# Patient Record
Sex: Male | Born: 1985 | Hispanic: No | Marital: Married | Smoking: Current some day smoker
Health system: Southern US, Community
[De-identification: ages and names within clinical notes are randomized; demographics above are authoritative.]

---

## 2017-06-10 ENCOUNTER — Ambulatory Visit: Payer: Self-pay | Admitting: Physician Assistant

## 2017-07-26 ENCOUNTER — Ambulatory Visit: Payer: Self-pay | Admitting: Emergency Medicine

## 2017-07-27 ENCOUNTER — Other Ambulatory Visit: Payer: Self-pay

## 2017-07-27 ENCOUNTER — Ambulatory Visit (INDEPENDENT_AMBULATORY_CARE_PROVIDER_SITE_OTHER): Payer: Commercial Managed Care - PPO

## 2017-07-27 ENCOUNTER — Encounter: Payer: Self-pay | Admitting: Family Medicine

## 2017-07-27 ENCOUNTER — Ambulatory Visit: Payer: Commercial Managed Care - PPO | Admitting: Family Medicine

## 2017-07-27 VITALS — BP 112/78 | HR 101 | Temp 98.2°F | Resp 18 | Ht 72.84 in | Wt 225.8 lb

## 2017-07-27 DIAGNOSIS — R05 Cough: Secondary | ICD-10-CM

## 2017-07-27 DIAGNOSIS — R059 Cough, unspecified: Secondary | ICD-10-CM

## 2017-07-27 DIAGNOSIS — R509 Fever, unspecified: Secondary | ICD-10-CM | POA: Diagnosis not present

## 2017-07-27 DIAGNOSIS — J189 Pneumonia, unspecified organism: Secondary | ICD-10-CM

## 2017-07-27 MED ORDER — HYDROCODONE-HOMATROPINE 5-1.5 MG/5ML PO SYRP: ORAL_SOLUTION | ORAL | 0 refills | Status: DC

## 2017-07-27 MED ORDER — AZITHROMYCIN 250 MG PO TABS
ORAL_TABLET | ORAL | 0 refills | Status: DC
Start: 2017-07-27 — End: 2020-02-26

## 2017-07-27 NOTE — Patient Instructions (Addendum)
I am suspicious of a pneumonia after a possible initial virus.  Start antibiotic, 2 pills tonight, then 1 pill/day for the next 4 days.  Mucinex during the day as needed for cough, Tylenol or Advil over-the-counter for fever and body aches.  Hydrocodone cough syrup if needed at bedtime.  Recheck in the next 2 to 3 days if not significantly improved, sooner or to the emergency room if worse.  Thank you for coming in today.   Community-Acquired Pneumonia, Adult Pneumonia is an infection of the lungs. One type of pneumonia can happen while a person is in a hospital. A different type can happen when a person is not in a hospital (community-acquired pneumonia). It is easy for this kind to spread from person to person. It can spread to you if you breathe near an infected person who coughs or sneezes. Some symptoms include:  A dry cough.  A wet (productive) cough.  Fever.  Sweating.  Chest pain.  Follow these instructions at home:  Take over-the-counter and prescription medicines only as told by your doctor. ? Only take cough medicine if you are losing sleep. ? If you were prescribed an antibiotic medicine, take it as told by your doctor. Do not stop taking the antibiotic even if you start to feel better.  Sleep with your head and neck raised (elevated). You can do this by putting a few pillows under your head, or you can sleep in a recliner.  Do not use tobacco products. These include cigarettes, chewing tobacco, and e-cigarettes. If you need help quitting, ask your doctor.  Drink enough water to keep your pee (urine) clear or pale yellow. A shot (vaccine) can help prevent pneumonia. Shots are often suggested for:  People older than 32 years of age.  People older than 32 years of age: ? Who are having cancer treatment. ? Who have long-term (chronic) lung disease. ? Who have problems with their body's defense system (immune system).  You may also prevent pneumonia if you take these  actions:  Get the flu (influenza) shot every year.  Go to the dentist as often as told.  Wash your hands often. If soap and water are not available, use hand sanitizer.  Contact a doctor if:  You have a fever.  You lose sleep because your cough medicine does not help. Get help right away if:  You are short of breath and it gets worse.  You have more chest pain.  Your sickness gets worse. This is very serious if: ? You are an older adult. ? Your body's defense system is weak.  You cough up blood. This information is not intended to replace advice given to you by your health care provider. Make sure you discuss any questions you have with your health care provider. Document Released: 07/27/2007 Document Revised: 07/16/2015 Document Reviewed: 06/04/2014 Elsevier Interactive Patient Education  2018 Elsevier Inc.  Cough, Adult Coughing is a reflex that clears your throat and your airways. Coughing helps to heal and protect your lungs. It is normal to cough occasionally, but a cough that happens with other symptoms or lasts a long time may be a sign of a condition that needs treatment. A cough may last only 2-3 weeks (acute), or it may last longer than 8 weeks (chronic). What are the causes? Coughing is commonly caused by:  Breathing in substances that irritate your lungs.  A viral or bacterial respiratory infection.  Allergies.  Asthma.  Postnasal drip.  Smoking.  Acid backing  up from the stomach into the esophagus (gastroesophageal reflux).  Certain medicines.  Chronic lung problems, including COPD (or rarely, lung cancer).  Other medical conditions such as heart failure.  Follow these instructions at home: Pay attention to any changes in your symptoms. Take these actions to help with your discomfort:  Take medicines only as told by your health care provider. ? If you were prescribed an antibiotic medicine, take it as told by your health care provider. Do not  stop taking the antibiotic even if you start to feel better. ? Talk with your health care provider before you take a cough suppressant medicine.  Drink enough fluid to keep your urine clear or pale yellow.  If the air is dry, use a cold steam vaporizer or humidifier in your bedroom or your home to help loosen secretions.  Avoid anything that causes you to cough at work or at home.  If your cough is worse at night, try sleeping in a semi-upright position.  Avoid cigarette smoke. If you smoke, quit smoking. If you need help quitting, ask your health care provider.  Avoid caffeine.  Avoid alcohol.  Rest as needed.  Contact a health care provider if:  You have new symptoms.  You cough up pus.  Your cough does not get better after 2-3 weeks, or your cough gets worse.  You cannot control your cough with suppressant medicines and you are losing sleep.  You develop pain that is getting worse or pain that is not controlled with pain medicines.  You have a fever.  You have unexplained weight loss.  You have night sweats. Get help right away if:  You cough up blood.  You have difficulty breathing.  Your heartbeat is very fast. This information is not intended to replace advice given to you by your health care provider. Make sure you discuss any questions you have with your health care provider. Document Released: 08/06/2010 Document Revised: 07/16/2015 Document Reviewed: 04/16/2014 Elsevier Interactive Patient Education  2018 [employer].    IF you received an x-ray today, you will receive an invoice from [facility]. Please contact [facility] at [phone] with questions or concerns regarding your invoice.   IF you received labwork today, you will receive an invoice from [facility]. Please contact [facility] at [phone] with questions or concerns regarding your invoice.   Our billing staff will not be able to assist you with questions regarding bills  from these companies.  You will be contacted with the lab results as soon as they are available. The fastest way to get your results is to activate your My Chart account. Instructions are located on the last page of this paperwork. If you have not heard from [place] regarding the results in 2 weeks, please contact this office.

## 2017-07-27 NOTE — Progress Notes (Signed)
Subjective:    Patient ID: Raymond Yates, male    DOB: [birth year], 32 y.o.   MRN: [id number]  HPI Raymond Yates is a 32 y.o. male Presents today for: Chief Complaint  Patient presents with  . Cough    hurts when he coughs feels like a burning in chest green mucus   . Anorexia  . Fatigue   Flew back from [place] last week, few days later - sneeze, cough, headache, decreased appetite, fatigue.  worse cough past few days. burning with cough.  Green phlegm past few days. Fever few days ago, treated with advil. drinking fluids ok, but decreased appetite.  No known chronic medical conditions. Tx: tylenol severe cold. Advil.  Minimal improvement. T102.7 this am. Improved with advil. Last dose 1pm.   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    There are no active problems to display for this patient.  History reviewed. No pertinent past medical history. History reviewed. No pertinent surgical history. No Known Allergies Prior to Admission medications   Not on File   Social History   Socioeconomic History  . Marital status: Married    Spouse name: Not on file  . Number of children: Not on file  . Years of education: Not on file  . Highest education level: Not on file  Occupational History  . Not on file  Social Needs  . Financial resource strain: Not on file  . Food insecurity:    Worry: Not on file    Inability: Not on file  . Transportation needs:    Medical: Not on file    Non-medical: Not on file  Tobacco Use  . Smoking status: Former Games developer  . Smokeless tobacco: Former Engineer, water and Sexual Activity  . Alcohol use: Not on file  . Drug use: Not on file  . Sexual activity: Not on file  Lifestyle  . Physical activity:    Days per week: Not on file    Minutes per session: Not on file  . Stress: Not on file  Relationships  . Social connections:    Talks on phone: Not on file    Gets together: Not on file    Attends religious service: Not on file    Active member of club or organization: Not on file    Attends meetings of clubs or organizations: Not on file    Relationship status: Not on file  . Intimate partner violence:    Fear of current or ex partner: Not on file    Emotionally abused: Not on file    Physically abused: Not on file    Forced sexual activity: Not on file  Other Topics Concern  . Not on file  Social History Narrative  . Not on file    Review of Systems  Constitutional: Positive for chills, fatigue and fever.  Respiratory: Positive for cough. Negative for choking, shortness of breath and wheezing.   Cardiovascular: Positive for chest pain (burning with cough. ).  Neurological: Positive for headaches.      Objective:   Physical Exam  Constitutional: He is oriented to person, place, and time. He appears well-developed and well-nourished.  HENT:  Head: Normocephalic and atraumatic.  Right Ear: Tympanic membrane, external ear and ear canal normal.  Left Ear: Tympanic membrane, external ear and ear canal normal.  Nose: No rhinorrhea.  Mouth/Throat: Oropharynx is clear and moist and mucous membranes are normal. No oropharyngeal exudate or posterior oropharyngeal erythema.  Eyes: Pupils are equal,  round, and reactive to light. Conjunctivae are normal.  Neck: Neck supple.  Cardiovascular: Normal rate, regular rhythm, normal heart sounds and intact distal pulses.  No murmur heard. Pulmonary/Chest: Effort normal. He has decreased breath sounds (distant, but normal effort, no distress. ). He has no wheezes. He has no rhonchi. He has no rales.  Abdominal: Soft. There is no tenderness.  Lymphadenopathy:    He has no cervical adenopathy.  Neurological: He is alert and oriented to person, place, and time.  Skin: Skin is warm and dry. No rash noted. He is not diaphoretic.  Psychiatric: He has a normal mood and affect. His behavior is normal.  Vitals reviewed.  Vitals:   07/27/17 1805  BP: 112/78  Pulse: (!) 101    Resp: 18  Temp: 98.2 F (36.8 C)  TempSrc: Oral  SpO2: 97%  Weight: 225 lb 12.8 oz (102.4 kg)  Height: 6' 0.84" (1.85 m)    Dg Chest 2 View  Result Date: 07/27/2017 CLINICAL DATA:  Cough x1 week, fever EXAM: CHEST - 2 VIEW COMPARISON:  None. FINDINGS: Lungs are clear.  No pleural effusion or pneumothorax. The heart is normal in size. Visualized osseous structures are within normal limits. IMPRESSION: Normal chest radiographs. Electronically Signed   By: Charline Bills M.D.   On: 07/27/2017 18:55        Assessment & Plan:    Raymond Yates is a 32 y.o. male Cough - Plan: DG Chest 2 View, azithromycin (ZITHROMAX) 250 MG tablet, HYDROcodone-homatropine (HYCODAN) 5-1.5 MG/5ML syrup  Fever, unspecified - Plan: DG Chest 2 View, azithromycin (ZITHROMAX) 250 MG tablet, HYDROcodone-homatropine (HYCODAN) 5-1.5 MG/5ML syrup  Community acquired pneumonia of right lung, unspecified part of lung - Plan: azithromycin (ZITHROMAX) 250 MG tablet  Possible initial viral illness with secondary sickening, fevers past few days, suspicious for early pneumonia.  On initial evaluation of his x-ray, suspicious for right lower lobe early infiltrate.   - X-ray report was negative, but will treat for possible early community-acquired pneumonia with azithromycin as above, hydrocodone cough syrup at night if needed, Mucinex during the day, and RTC precautions if not improving in the next few days.  ER precautions given if worsening.  Meds ordered this encounter  Medications  . azithromycin (ZITHROMAX) 250 MG tablet    Sig: Take 2 pills by mouth on day 1, then 1 pill by mouth per day on days 2 through 5.    Dispense:  6 tablet    Refill:  0  . HYDROcodone-homatropine (HYCODAN) 5-1.5 MG/5ML syrup    Sig: 72m by mouth a bedtime as needed for cough.    Dispense:  120 mL    Refill:  0   Patient Instructions    I am suspicious of a pneumonia after a possible initial virus.  Start antibiotic, 2 pills  tonight, then 1 pill/day for the next 4 days.  Mucinex during the day as needed for cough, Tylenol or Advil over-the-counter for fever and body aches.  Hydrocodone cough syrup if needed at bedtime.  Recheck in the next 2 to 3 days if not significantly improved, sooner or to the emergency room if worse.  Thank you for coming in today.   Community-Acquired Pneumonia, Adult Pneumonia is an infection of the lungs. One type of pneumonia can happen while a person is in a hospital. A different type can happen when a person is not in a hospital (community-acquired pneumonia). It is easy for this kind to spread from person to person.  It can spread to you if you breathe near an infected person who coughs or sneezes. Some symptoms include:  A dry cough.  A wet (productive) cough.  Fever.  Sweating.  Chest pain.  Follow these instructions at home:  Take over-the-counter and prescription medicines only as told by your doctor. ? Only take cough medicine if you are losing sleep. ? If you were prescribed an antibiotic medicine, take it as told by your doctor. Do not stop taking the antibiotic even if you start to feel better.  Sleep with your head and neck raised (elevated). You can do this by putting a few pillows under your head, or you can sleep in a recliner.  Do not use tobacco products. These include cigarettes, chewing tobacco, and e-cigarettes. If you need help quitting, ask your doctor.  Drink enough water to keep your pee (urine) clear or pale yellow. A shot (vaccine) can help prevent pneumonia. Shots are often suggested for:  People older than 32 years of age.  People older than 32 years of age: ? Who are having cancer treatment. ? Who have long-term (chronic) lung disease. ? Who have problems with their body's defense system (immune system).  You may also prevent pneumonia if you take these actions:  Get the flu (influenza) shot every year.  Go to the dentist as often as  told.  Wash your hands often. If soap and water are not available, use hand sanitizer.  Contact a doctor if:  You have a fever.  You lose sleep because your cough medicine does not help. Get help right away if:  You are short of breath and it gets worse.  You have more chest pain.  Your sickness gets worse. This is very serious if: ? You are an older adult. ? Your body's defense system is weak.  You cough up blood. This information is not intended to replace advice given to you by your health care provider. Make sure you discuss any questions you have with your health care provider. Document Released: 07/27/2007 Document Revised: 07/16/2015 Document Reviewed: 06/04/2014 Elsevier Interactive Patient Education  2018 Elsevier Inc.  Cough, Adult Coughing is a reflex that clears your throat and your airways. Coughing helps to heal and protect your lungs. It is normal to cough occasionally, but a cough that happens with other symptoms or lasts a long time may be a sign of a condition that needs treatment. A cough may last only 2-3 weeks (acute), or it may last longer than 8 weeks (chronic). What are the causes? Coughing is commonly caused by:  Breathing in substances that irritate your lungs.  A viral or bacterial respiratory infection.  Allergies.  Asthma.  Postnasal drip.  Smoking.  Acid backing up from the stomach into the esophagus (gastroesophageal reflux).  Certain medicines.  Chronic lung problems, including COPD (or rarely, lung cancer).  Other medical conditions such as heart failure.  Follow these instructions at home: Pay attention to any changes in your symptoms. Take these actions to help with your discomfort:  Take medicines only as told by your health care provider. ? If you were prescribed an antibiotic medicine, take it as told by your health care provider. Do not stop taking the antibiotic even if you start to feel better. ? Talk with your health  care provider before you take a cough suppressant medicine.  Drink enough fluid to keep your urine clear or pale yellow.  If the air is dry, use a cold steam vaporizer  or humidifier in your bedroom or your home to help loosen secretions.  Avoid anything that causes you to cough at work or at home.  If your cough is worse at night, try sleeping in a semi-upright position.  Avoid cigarette smoke. If you smoke, quit smoking. If you need help quitting, ask your health care provider.  Avoid caffeine.  Avoid alcohol.  Rest as needed.  Contact a health care provider if:  You have new symptoms.  You cough up pus.  Your cough does not get better after 2-3 weeks, or your cough gets worse.  You cannot control your cough with suppressant medicines and you are losing sleep.  You develop pain that is getting worse or pain that is not controlled with pain medicines.  You have a fever.  You have unexplained weight loss.  You have night sweats. Get help right away if:  You cough up blood.  You have difficulty breathing.  Your heartbeat is very fast. This information is not intended to replace advice given to you by your health care provider. Make sure you discuss any questions you have with your health care provider. Document Released: 08/06/2010 Document Revised: 07/16/2015 Document Reviewed: 04/16/2014 Elsevier Interactive Patient Education  2018 [employer].    IF you received an x-ray today, you will receive an invoice from [facility]. Please contact [facility] at [phone] with questions or concerns regarding your invoice.   IF you received labwork today, you will receive an invoice from [facility]. Please contact [facility] at [phone] with questions or concerns regarding your invoice.   Our billing staff will not be able to assist you with questions regarding bills from these companies.  You will be contacted with the lab results as soon as they  are available. The fastest way to get your results is to activate your My Chart account. Instructions are located on the last page of this paperwork. If you have not heard from [place] regarding the results in 2 weeks, please contact this office.     Signed,   Meredith Staggers, MD Primary Care at [facility].  07/27/17 7:01 PM

## 2018-02-21 DIAGNOSIS — E109 Type 1 diabetes mellitus without complications: Secondary | ICD-10-CM | POA: Diagnosis not present

## 2018-03-09 DIAGNOSIS — E8881 Metabolic syndrome: Secondary | ICD-10-CM | POA: Diagnosis not present

## 2018-03-24 DIAGNOSIS — E109 Type 1 diabetes mellitus without complications: Secondary | ICD-10-CM | POA: Diagnosis not present

## 2018-04-22 DIAGNOSIS — E109 Type 1 diabetes mellitus without complications: Secondary | ICD-10-CM | POA: Diagnosis not present

## 2018-05-23 DIAGNOSIS — E109 Type 1 diabetes mellitus without complications: Secondary | ICD-10-CM | POA: Diagnosis not present

## 2019-05-14 IMAGING — DX DG CHEST 2V
2 series · 2 of 2 positions shown · non-contrast
Comparison: None.

CLINICAL DATA: Cough x1 week, fever

EXAM:
CHEST - 2 VIEW

[chest pa]
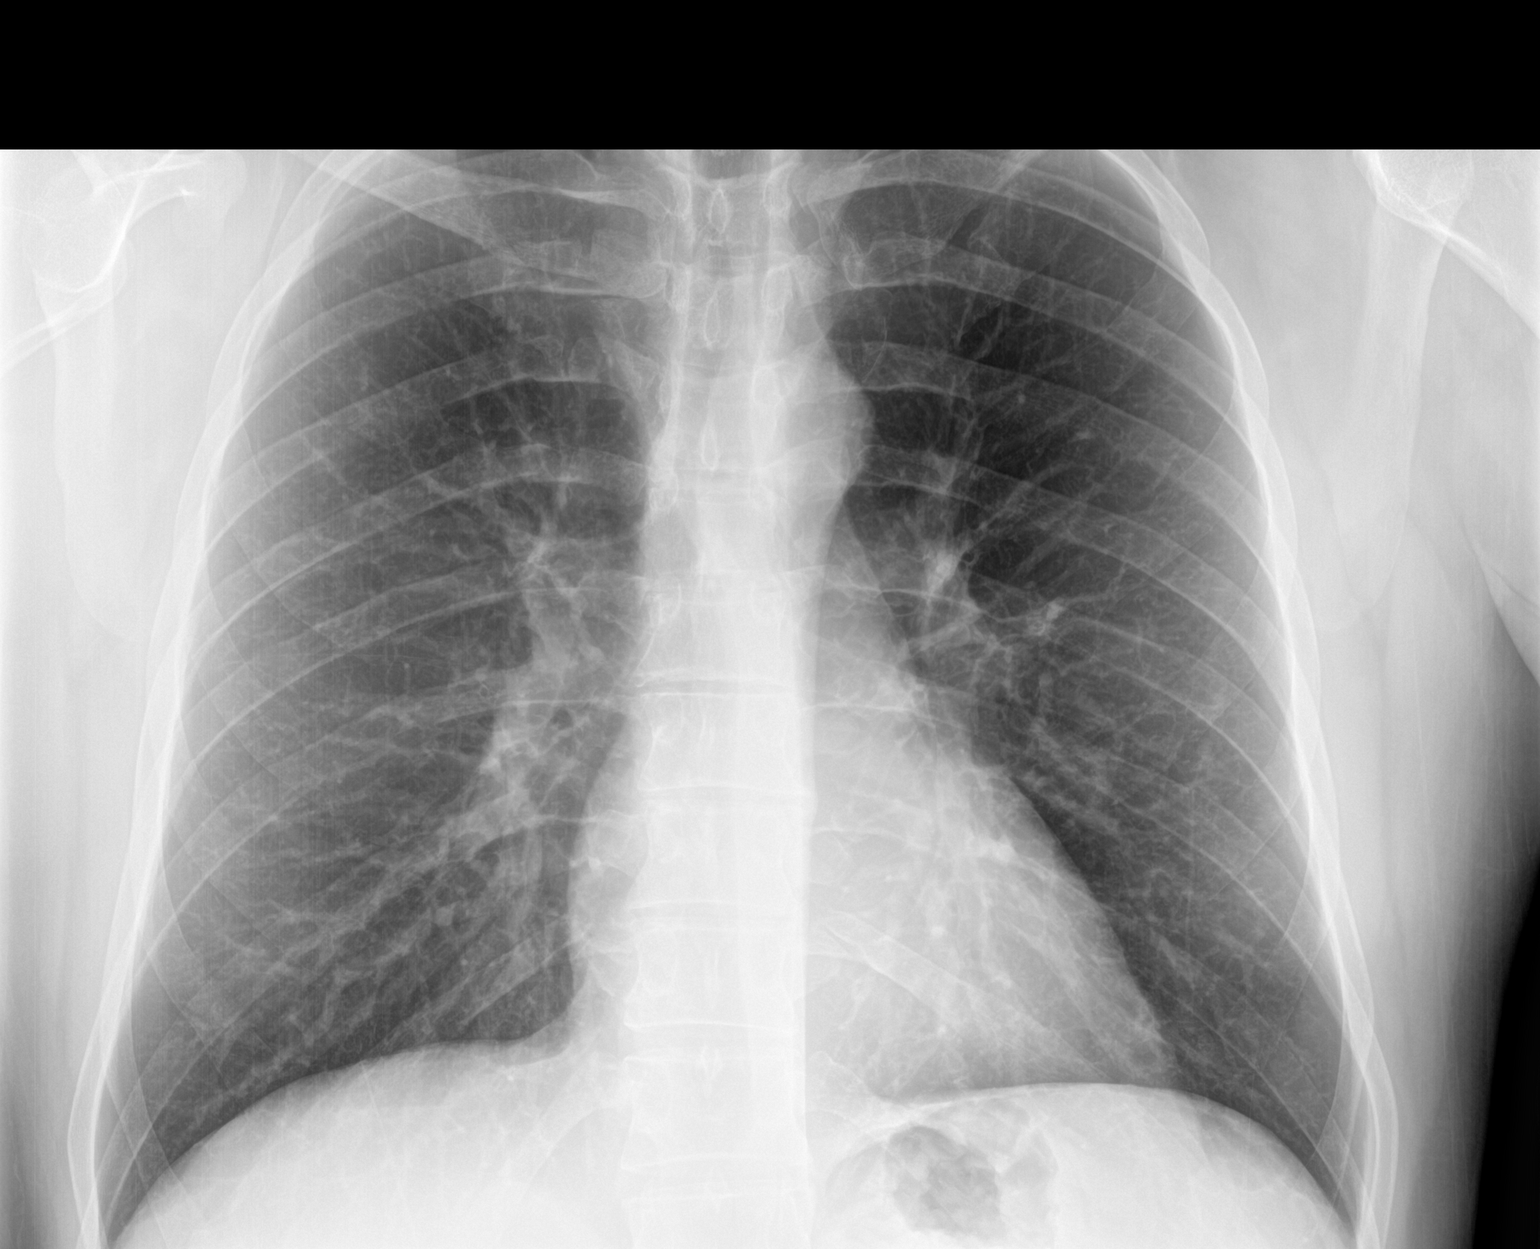

[chest lat]
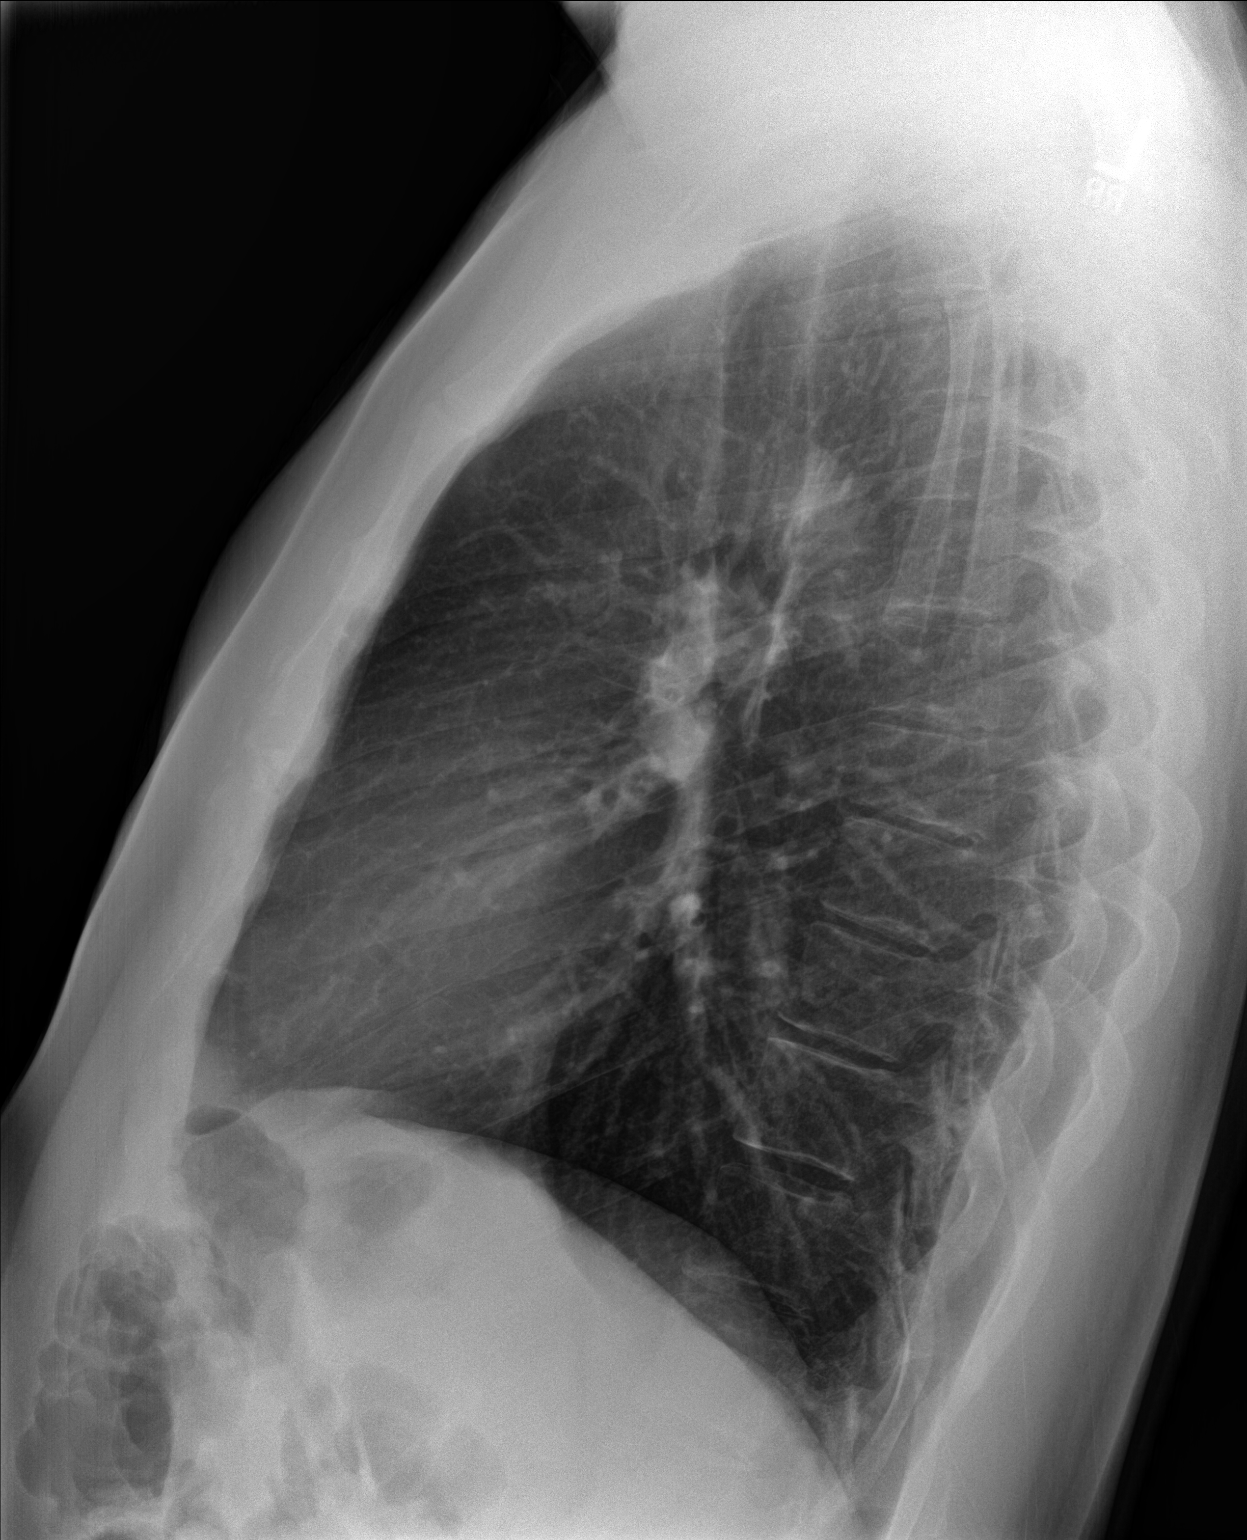

[2 of 2 positions shown; findings below may reference images not displayed]

FINDINGS: Lungs are clear.  No pleural effusion or pneumothorax.

The heart is normal in size.

Visualized osseous structures are within normal limits.
IMPRESSION: Normal chest radiographs.

## 2020-02-26 ENCOUNTER — Telehealth (INDEPENDENT_AMBULATORY_CARE_PROVIDER_SITE_OTHER): Payer: Managed Care, Other (non HMO) | Admitting: Family Medicine

## 2020-02-26 ENCOUNTER — Other Ambulatory Visit: Payer: Self-pay

## 2020-02-26 ENCOUNTER — Encounter: Payer: Self-pay | Admitting: Family Medicine

## 2020-02-26 VITALS — Temp 100.0°F | Ht 72.0 in

## 2020-02-26 DIAGNOSIS — U071 COVID-19: Secondary | ICD-10-CM | POA: Diagnosis not present

## 2020-02-26 MED ORDER — BENZONATATE 100 MG PO CAPS: 100.0000 mg | ORAL_CAPSULE | Freq: Three times a day (TID) | ORAL | 0 refills | Status: DC | PRN

## 2020-02-26 MED ORDER — MUCINEX DM MAXIMUM STRENGTH 60-1200 MG PO TB12: 1.0000 | ORAL_TABLET | Freq: Two times a day (BID) | ORAL | 0 refills | Status: DC

## 2020-02-26 NOTE — Progress Notes (Signed)
Virtual Visit Note  I connected with patient on 02/26/20 at 1203 by telephone due to unable to work Epic video visit and verified that I am speaking with the correct person using two identifiers. Raymond Yates is currently located at home and no family members are currently with them during visit. The provider, Azalee Course Yelitza Reach, FNP is located in their office at time of visit.  I discussed the limitations, risks, security and privacy concerns of performing an evaluation and management service by telephone and the availability of in person appointments. I also discussed with the patient that there may be a patient responsible charge related to this service. The patient expressed understanding and agreed to proceed.   I provided 20 minutes of non-face-to-face time during this encounter.  Chief Complaint  Patient presents with  . Covid Positive    Tested 02/23/19 - cough- green congestion, fever 100 today., bad headache, congestion taking tylenol and immune support vitamins    HPI ? Tested positive for covid on 02/24/19 Sunday night noticed headache and start of fever By Monday morning myalgias and fever and headaches got worse Max fever 101 Was using 650mg  Tylenol and was taking this around the clock Fever still continues Had vomiting on Monday and Tuesday Fatigue, headache, chest congestion, sputum greenish Not vaccinated against covid Would like antibiotics   No Known Allergies  Prior to Admission medications   Not on File    History reviewed. No pertinent past medical history.  History reviewed. No pertinent surgical history.  Social History   Tobacco Use  . Smoking status: Former Monday  . Smokeless tobacco: Former Games developer Use Topics  . Alcohol use: Not on file    History reviewed. No pertinent family history.  Review of Systems  Constitutional: Positive for chills, fever and malaise/fatigue. Negative for weight loss.  HENT: Positive for congestion.  Negative for ear discharge, ear pain, sinus pain and sore throat.   Respiratory: Positive for cough and sputum production. Negative for shortness of breath and wheezing.   Cardiovascular: Negative for chest pain and palpitations.  Gastrointestinal: Negative for abdominal pain, constipation, diarrhea, heartburn, nausea and vomiting.  Musculoskeletal: Positive for myalgias.  Neurological: Positive for dizziness and headaches.    Objective  Constitutional:      General: She is not in acute distress.    Appearance: Normal appearance. She is not ill-appearing.   Pulmonary:     Effort: Pulmonary effort is normal. No respiratory distress.  Neurological:     Mental Status: She is alert and oriented to person, place, and time.  Psychiatric:        Mood and Affect: Mood normal.        Behavior: Behavior normal.     ASSESSMENT and PLAN  Problem List Items Addressed This Visit   None   Visit Diagnoses    COVID-19    -  Primary   Relevant Medications   Dextromethorphan-guaiFENesin (MUCINEX DM MAXIMUM STRENGTH) 60-1200 MG TB12   benzonatate (TESSALON) 100 MG capsule    Plan  Discussed conservative treatments  Discussed r/se/b of medications  Educated on RTC/ED precautions  Educated on how antibiotics are not used for viral illnesses  Discussed course of illness and answered all questions   Return if symptoms worsen or fail to improve.    The above assessment and management plan was discussed with the patient. The patient verbalized understanding of and has agreed to the management plan. Patient is aware to call the clinic  if symptoms persist or worsen. Patient is aware when to return to the clinic for a follow-up visit. Patient educated on when it is appropriate to go to the emergency department.     Raymond Carls Leeum Sankey, FNP-BC Primary Care at [facility] [address] [place], [place] [address] Ph.  [phone] Fax [phone]

## 2020-02-26 NOTE — Patient Instructions (Addendum)
 COVID-19 COVID-19 is a respiratory infection that is caused by a virus called severe acute respiratory syndrome coronavirus 2 (SARS-CoV-2). The disease is also known as coronavirus disease or novel coronavirus. In some people, the virus may not cause any symptoms. In others, it may cause a serious infection. The infection can get worse quickly and can lead to complications, such as:  Pneumonia, or infection of the lungs.  Acute respiratory distress syndrome or ARDS. This is a condition in which fluid build-up in the lungs prevents the lungs from filling with air and passing oxygen into the blood.  Acute respiratory failure. This is a condition in which there is not enough oxygen passing from the lungs to the body or when carbon dioxide is not passing from the lungs out of the body.  Sepsis or septic shock. This is a serious bodily reaction to an infection.  Blood clotting problems.  Secondary infections due to bacteria or fungus.  Organ failure. This is when your body's organs stop working. The virus that causes COVID-19 is contagious. This means that it can spread from person to person through droplets from coughs and sneezes (respiratory secretions). What are the causes? This illness is caused by a virus. You may catch the virus by:  Breathing in droplets from an infected person. Droplets can be spread by a person breathing, speaking, singing, coughing, or sneezing.  Touching something, like a table or a doorknob, that was exposed to the virus (contaminated) and then touching your mouth, nose, or eyes. What increases the risk? Risk for infection You are more likely to be infected with this virus if you:  Are within 6 feet (2 meters) of a person with COVID-19.  Provide care for or live with a person who is infected with COVID-19.  Spend time in crowded indoor spaces or live in shared housing. Risk for serious illness You are more likely to become seriously ill from the virus if  you:  Are 50 years of age or older. The higher your age, the more you are at risk for serious illness.  Live in a nursing home or long-term care facility.  Have cancer.  Have a long-term (chronic) disease such as: ? Chronic lung disease, including chronic obstructive pulmonary disease or asthma. ? A long-term disease that lowers your body's ability to fight infection (immunocompromised). ? Heart disease, including heart failure, a condition in which the arteries that lead to the heart become narrow or blocked (coronary artery disease), a disease which makes the heart muscle thick, weak, or stiff (cardiomyopathy). ? Diabetes. ? Chronic kidney disease. ? Sickle cell disease, a condition in which red blood cells have an abnormal "sickle" shape. ? Liver disease.  Are obese. What are the signs or symptoms? Symptoms of this condition can range from mild to severe. Symptoms may appear any time from 2 to 14 days after being exposed to the virus. They include:  A fever or chills.  A cough.  Difficulty breathing.  Headaches, body aches, or muscle aches.  Runny or stuffy (congested) nose.  A sore throat.  New loss of taste or smell. Some people may also have stomach problems, such as nausea, vomiting, or diarrhea. Other people may not have any symptoms of COVID-19. How is this diagnosed? This condition may be diagnosed based on:  Your signs and symptoms, especially if: ? You live in an area with a COVID-19 outbreak. ? You recently traveled to or from an area where the virus is common. ?   You provide care for or live with a person who was diagnosed with COVID-19. ? You were exposed to a person who was diagnosed with COVID-19.  A physical exam.  Lab tests, which may include: ? Taking a sample of fluid from the back of your nose and throat (nasopharyngeal fluid), your nose, or your throat using a swab. ? A sample of mucus from your lungs (sputum). ? Blood tests.  Imaging tests,  which may include, X-rays, CT scan, or ultrasound. How is this treated? At present, there is no medicine to treat COVID-19. Medicines that treat other diseases are being used on a trial basis to see if they are effective against COVID-19. Your health care provider will talk with you about ways to treat your symptoms. For most people, the infection is mild and can be managed at home with rest, fluids, and over-the-counter medicines. Treatment for a serious infection usually takes places in a hospital intensive care unit (ICU). It may include one or more of the following treatments. These treatments are given until your symptoms improve.  Receiving fluids and medicines through an IV.  Supplemental oxygen. Extra oxygen is given through a tube in the nose, a face mask, or a hood.  Positioning you to lie on your stomach (prone position). This makes it easier for oxygen to get into the lungs.  Continuous positive airway pressure (CPAP) or bi-level positive airway pressure (BPAP) machine. This treatment uses mild air pressure to keep the airways open. A tube that is connected to a motor delivers oxygen to the body.  Ventilator. This treatment moves air into and out of the lungs by using a tube that is placed in your windpipe.  Tracheostomy. This is a procedure to create a hole in the neck so that a breathing tube can be inserted.  Extracorporeal membrane oxygenation (ECMO). This procedure gives the lungs a chance to recover by taking over the functions of the heart and lungs. It supplies oxygen to the body and removes carbon dioxide. Follow these instructions at home: Lifestyle  If you are sick, stay home except to get medical care. Your health care provider will tell you how long to stay home. Call your health care provider before you go for medical care.  Rest at home as told by your health care provider.  Do not use any products that contain nicotine or tobacco, such as cigarettes,  e-cigarettes, and chewing tobacco. If you need help quitting, ask your health care provider.  Return to your normal activities as told by your health care provider. Ask your health care provider what activities are safe for you. General instructions  Take over-the-counter and prescription medicines only as told by your health care provider.  Drink enough fluid to keep your urine pale yellow.  Keep all follow-up visits as told by your health care provider. This is important. How is this prevented?  There is no vaccine to help prevent COVID-19 infection. However, there are steps you can take to protect yourself and others from this virus. To protect yourself:   Do not travel to areas where COVID-19 is a risk. The areas where COVID-19 is reported change often. To identify high-risk areas and travel restrictions, check the CDC travel website: wwwnc.cdc.gov/travel/notices  If you live in, or must travel to, an area where COVID-19 is a risk, take precautions to avoid infection. ? Stay away from people who are sick. ? Wash your hands often with soap and water for 20 seconds. If soap and   water are not available, use an alcohol-based hand sanitizer. ? Avoid touching your mouth, face, eyes, or nose. ? Avoid going out in public, follow guidance from your state and local health authorities. ? If you must go out in public, wear a cloth face covering or face mask. Make sure your mask covers your nose and mouth. ? Avoid crowded indoor spaces. Stay at least 6 feet (2 meters) away from others. ? Disinfect objects and surfaces that are frequently touched every day. This may include:  Counters and tables.  Doorknobs and light switches.  Sinks and faucets.  Electronics, such as phones, remote controls, keyboards, computers, and tablets. To protect others: If you have symptoms of COVID-19, take steps to prevent the virus from spreading to others.  If you think you have a COVID-19 infection, contact  your health care provider right away. Tell your health care team that you think you may have a COVID-19 infection.  Stay home. Leave your house only to seek medical care. Do not use public transport.  Do not travel while you are sick.  Wash your hands often with soap and water for 20 seconds. If soap and water are not available, use alcohol-based hand sanitizer.  Stay away from other members of your household. Let healthy household members care for children and pets, if possible. If you have to care for children or pets, wash your hands often and wear a mask. If possible, stay in your own room, separate from others. Use a different bathroom.  Make sure that all people in your household wash their hands well and often.  Cough or sneeze into a tissue or your sleeve or elbow. Do not cough or sneeze into your hand or into the air.  Wear a cloth face covering or face mask. Make sure your mask covers your nose and mouth. Where to find more information  Centers for Disease Control and Prevention: www.cdc.gov/coronavirus/2019-ncov/index.html  World Health Organization: www.who.int/health-topics/coronavirus Contact a health care provider if:  You live in or have traveled to an area where COVID-19 is a risk and you have symptoms of the infection.  You have had contact with someone who has COVID-19 and you have symptoms of the infection. Get help right away if:  You have trouble breathing.  You have pain or pressure in your chest.  You have confusion.  You have bluish lips and fingernails.  You have difficulty waking from sleep.  You have symptoms that get worse. These symptoms may represent a serious problem that is an emergency. Do not wait to see if the symptoms will go away. Get medical help right away. Call your local emergency services (911 in the [place] Do not drive yourself to the hospital. Let the emergency medical personnel know if you think you have  COVID-19. Summary  COVID-19 is a respiratory infection that is caused by a virus. It is also known as coronavirus disease or novel coronavirus. It can cause serious infections, such as pneumonia, acute respiratory distress syndrome, acute respiratory failure, or sepsis.  The virus that causes COVID-19 is contagious. This means that it can spread from person to person through droplets from breathing, speaking, singing, coughing, or sneezing.  You are more likely to develop a serious illness if you are 50 years of age or older, have a weak immune system, live in a nursing home, or have chronic disease.  There is no medicine to treat COVID-19. Your health care provider will talk with you about ways to treat your   symptoms.  Take steps to protect yourself and others from infection. Wash your hands often and disinfect objects and surfaces that are frequently touched every day. Stay away from people who are sick and wear a mask if you are sick. This information is not intended to replace advice given to you by your health care provider. Make sure you discuss any questions you have with your health care provider. Document Revised: 12/07/2018 Document Reviewed: 03/15/2018 Elsevier Patient Education  2020 Elsevier Inc.   If you have lab work done today you will be contacted with your lab results within the next 2 weeks.  If you have not heard from us then please contact us. The fastest way to get your results is to register for My Chart.   IF you received an x-ray today, you will receive an invoice from [facility]. Please contact [facility] at [phone] with questions or concerns regarding your invoice.   IF you received labwork today, you will receive an invoice from [facility]. Please contact [facility] at [phone] with questions or concerns regarding your invoice.   Our billing staff will not be able to assist you with questions regarding bills from these companies.  You  will be contacted with the lab results as soon as they are available. The fastest way to get your results is to activate your My Chart account. Instructions are located on the last page of this paperwork. If you have not heard from us regarding the results in 2 weeks, please contact this office.      

## 2020-03-05 ENCOUNTER — Other Ambulatory Visit: Payer: Self-pay

## 2020-03-05 ENCOUNTER — Encounter: Payer: Self-pay | Admitting: Family Medicine

## 2020-03-05 ENCOUNTER — Ambulatory Visit (INDEPENDENT_AMBULATORY_CARE_PROVIDER_SITE_OTHER): Payer: Managed Care, Other (non HMO)

## 2020-03-05 ENCOUNTER — Ambulatory Visit: Payer: Managed Care, Other (non HMO) | Admitting: Family Medicine

## 2020-03-05 ENCOUNTER — Telehealth: Payer: Self-pay | Admitting: Family Medicine

## 2020-03-05 ENCOUNTER — Ambulatory Visit: Payer: Managed Care, Other (non HMO)

## 2020-03-05 VITALS — BP 133/80 | HR 80 | Temp 98.7°F | Ht 72.0 in | Wt 241.0 lb

## 2020-03-05 DIAGNOSIS — Z13220 Encounter for screening for lipoid disorders: Secondary | ICD-10-CM

## 2020-03-05 DIAGNOSIS — Z114 Encounter for screening for human immunodeficiency virus [HIV]: Secondary | ICD-10-CM

## 2020-03-05 DIAGNOSIS — Z1329 Encounter for screening for other suspected endocrine disorder: Secondary | ICD-10-CM

## 2020-03-05 DIAGNOSIS — Z1321 Encounter for screening for nutritional disorder: Secondary | ICD-10-CM | POA: Diagnosis not present

## 2020-03-05 DIAGNOSIS — Z1159 Encounter for screening for other viral diseases: Secondary | ICD-10-CM | POA: Diagnosis not present

## 2020-03-05 DIAGNOSIS — R059 Cough, unspecified: Secondary | ICD-10-CM

## 2020-03-05 DIAGNOSIS — U071 COVID-19: Secondary | ICD-10-CM

## 2020-03-05 DIAGNOSIS — Z131 Encounter for screening for diabetes mellitus: Secondary | ICD-10-CM

## 2020-03-05 MED ORDER — BENZONATATE 100 MG PO CAPS: 100.0000 mg | ORAL_CAPSULE | Freq: Three times a day (TID) | ORAL | 0 refills | Status: AC | PRN

## 2020-03-05 MED ORDER — MUCINEX DM MAXIMUM STRENGTH 60-1200 MG PO TB12: 1.0000 | ORAL_TABLET | Freq: Two times a day (BID) | ORAL | 0 refills | Status: AC

## 2020-03-05 NOTE — Telephone Encounter (Signed)
I have spoken to up front and she stated that the pt is requesting a letter for work. He was last seen on 02/26/20. Please advise if witting another letter is appropriate.   Thanks

## 2020-03-05 NOTE — Telephone Encounter (Signed)
Patient states that he called yesterday and is calling back today / patient has to have letter documenting the patients need for time off  Work. For today and tomorrow ,   Needs to be sent to my chart  Or email  Or call patient to pick up letter .    Please advise patient .    Patient is also  aslking for antibiotics pat is coughing up green mucous,

## 2020-03-05 NOTE — Patient Instructions (Addendum)
Tylenol 650mg  every 4-6 hours Mucinex twice a day Tessalon three times a day   Cough, Adult A cough helps to clear your throat and lungs. A cough may be a sign of an illness or another medical condition. An acute cough may only last 2-3 weeks, while a chronic cough may last 8 or more weeks. Many things can cause a cough. They include:  Germs (viruses or bacteria) that attack the airway.  Breathing in things that bother (irritate) your lungs.  Allergies.  Asthma.  Mucus that runs down the back of your throat (postnasal drip).  Smoking.  Acid backing up from the stomach into the tube that moves food from the mouth to the stomach (gastroesophageal reflux).  Some medicines.  Lung problems.  Other medical conditions, such as heart failure or a blood clot in the lung (pulmonary embolism). Follow these instructions at home: Medicines  Take over-the-counter and prescription medicines only as told by your doctor.  Talk with your doctor before you take medicines that stop a cough (cough suppressants). Lifestyle  Do not smoke, and try not to be around smoke. Do not use any products that contain nicotine or tobacco, such as cigarettes, e-cigarettes, and chewing tobacco. If you need help quitting, ask your doctor.  Drink enough fluid to keep your pee (urine) pale yellow.  Avoid caffeine.  Do not drink alcohol if your doctor tells you not to drink.   General instructions  Watch for any changes in your cough. Tell your doctor about them.  Always cover your mouth when you cough.  Stay away from things that make you cough, such as perfume, candles, campfire smoke, or cleaning products.  If the air is dry, use a cool mist vaporizer or humidifier in your home.  If your cough is worse at night, try using extra pillows to raise your head up higher while you sleep.  Rest as needed.  Keep all follow-up visits as told by your doctor. This is important.   Contact a doctor if:  You  have new symptoms.  You cough up pus.  Your cough does not get better after 2-3 weeks, or your cough gets worse.  Cough medicine does not help your cough and you are not sleeping well.  You have pain that gets worse or pain that is not helped with medicine.  You have a fever.  You are losing weight and you do not know why.  You have night sweats. Get help right away if:  You cough up blood.  You have trouble breathing.  Your heartbeat is very fast. These symptoms may be an emergency. Do not wait to see if the symptoms will go away. Get medical help right away. Call your local emergency services (911 in the [place] Do not drive yourself to the hospital. Summary  A cough helps to clear your throat and lungs. Many things can cause a cough.  Take over-the-counter and prescription medicines only as told by your doctor.  Always cover your mouth when you cough.  Contact a doctor if you have new symptoms or you have a cough that does not get better or gets worse. This information is not intended to replace advice given to you by your health care provider. Make sure you discuss any questions you have with your health care provider. Document Revised: 03/29/2019 Document Reviewed: 02/26/2018 Elsevier Patient Education  2021 2022.

## 2020-03-05 NOTE — Telephone Encounter (Signed)
I have called pt back to discuss the how he is feeling and that fact that he would like a letter writing him out of work for today and tomorrow. He says he hopes he feels better by Monday.   He stated that his sx are worse the last couple of days. He states that he feels more fatigue, Headaches, and has very bad cough. He feels like since he is coughing so much his chest and throat are starting to hurt. Pt is asking about possible antibiotics or anything that he can help the the sx and requesting the Letter.  Thanks, [employer]

## 2020-03-05 NOTE — Telephone Encounter (Signed)
I have called pt and scheduled him for 4 pm today to get further evaluation and also to get a letter  FYI to provider

## 2020-03-05 NOTE — Progress Notes (Signed)
1/13/20225:09 PM  Raymond Yates May 23, 1985, 35 y.o., male [id number]  Chief Complaint  Patient presents with  . Cough    For about a week , last 72 hr pain in med chest w/ cough and dark green - did not pick up rx mucinex due it costs $50    . Fatigue  . Headache    Body aches no fever    HPI:   Patient is a 35 y.o. male with no significant past medical history who presents today for cough due to COVID   Tested positive for covid on 02/24/19 Prior symptoms of: Fever, vomiting,Fatigue, headache, chest congestion, sputum greenish, myalgias Not vaccinated against covid Cough continues to linger Headache, body aches, and fatigue continue Using Tylenol for symptoms  Previously prescribed Tessalon and Mucinex   Depression screen [name] 2/9 03/05/2020 02/26/2020 07/27/2017  Decreased Interest 0 0 0  Down, Depressed, Hopeless 0 0 0  PHQ - 2 Score 0 0 0    Fall Risk  03/05/2020 02/26/2020 07/27/2017  Falls in the past year? 0 0 No  Number falls in past yr: 0 0 -  Injury with Fall? 0 0 -  Follow up Falls evaluation completed Falls evaluation completed -     No Known Allergies  Prior to Admission medications   Medication Sig Start Date End Date Taking? Authorizing Provider  benzonatate (TESSALON) 100 MG capsule Take 1-2 capsules (100-200 mg total) by mouth 3 (three) times daily as needed for cough. 02/26/20   Seylah Wernert, Laurita Quint, FNP  Dextromethorphan-guaiFENesin (MUCINEX DM MAXIMUM STRENGTH) 60-1200 MG TB12 Take 1 tablet by mouth every 12 (twelve) hours. 02/26/20   Lio Wehrly, Laurita Quint, FNP    History reviewed. No pertinent past medical history.  History reviewed. No pertinent surgical history.  Social History   Tobacco Use  . Smoking status: Former Research scientist (life sciences)  . Smokeless tobacco: Former Network engineer Use Topics  . Alcohol use: Not on file    History reviewed. No pertinent family history.  Review of Systems  Constitutional: Negative for chills, fever and malaise/fatigue.  Eyes: Negative  for blurred vision and double vision.  Respiratory: Positive for cough. Negative for shortness of breath and wheezing.   Cardiovascular: Negative for chest pain, palpitations and leg swelling.  Gastrointestinal: Negative for abdominal pain, blood in stool, constipation, diarrhea, heartburn, nausea and vomiting.  Genitourinary: Negative for dysuria, frequency and hematuria.  Musculoskeletal: Negative for back pain and joint pain.  Skin: Negative for rash.  Neurological: Negative for dizziness, weakness and headaches.     OBJECTIVE:  Today's Vitals   03/05/20 1610  BP: 133/80  Pulse: 80  Temp: 98.7 F (37.1 C)  SpO2: 97%  Weight: 241 lb (109.3 kg)  Height: 6' (1.829 m)   Body mass index is 32.69 kg/m.   Physical Exam Vitals reviewed.  Constitutional:      Appearance: Normal appearance.  HENT:     Head: Normocephalic and atraumatic.  Eyes:     Conjunctiva/sclera: Conjunctivae normal.     Pupils: Pupils are equal, round, and reactive to light.  Cardiovascular:     Rate and Rhythm: Normal rate and regular rhythm.     Pulses: Normal pulses.     Heart sounds: Normal heart sounds. No murmur heard. No friction rub. No gallop.   Pulmonary:     Effort: Pulmonary effort is normal. No respiratory distress.     Breath sounds: Normal breath sounds. No stridor. No wheezing or rales.  Abdominal:  General: Bowel sounds are normal.     Palpations: Abdomen is soft.     Tenderness: There is no abdominal tenderness.  Musculoskeletal:     Right lower leg: No edema.     Left lower leg: No edema.  Skin:    General: Skin is warm and dry.  Neurological:     General: No focal deficit present.     Mental Status: He is alert and oriented to person, place, and time.  Psychiatric:        Mood and Affect: Mood normal.        Behavior: Behavior normal.     No results found for this or any previous visit (from the past 24 hour(s)).  DG Chest 2 View  Result Date: 03/05/2020 CLINICAL  DATA:  Cough EXAM: CHEST - 2 VIEW COMPARISON:  February 17, 2018 FINDINGS: The lungs are clear. The heart size and pulmonary vascularity are normal. No adenopathy. No bone lesions. IMPRESSION: Lungs clear.  Cardiac silhouette normal. Electronically Signed   By: Lowella Grip III M.D.   On: 03/05/2020 16:25     ASSESSMENT and PLAN  Problem List Items Addressed This Visit   None   Visit Diagnoses    Screening for HIV (human immunodeficiency virus)    -  Primary   Relevant Orders   HIV Antibody (routine testing w rflx)   Encounter for hepatitis C screening test for low risk patient       Relevant Orders   Hepatitis C antibody   Encounter for screening for lipid disorder       Relevant Orders   Lipid Panel   Encounter for vitamin deficiency screening       Relevant Orders   Vitamin D, 25-hydroxy   Screening for diabetes mellitus       Relevant Orders   CMP14+EGFR   Hemoglobin A1c   Screening for thyroid disorder       Relevant Orders   TSH   Cough       Relevant Medications   benzonatate (TESSALON) 100 MG capsule   Dextromethorphan-guaiFENesin (MUCINEX DM MAXIMUM STRENGTH) 60-1200 MG TB12   Other Relevant Orders   DG Chest 2 View (Completed)   CBC with Differential   COVID-19       Relevant Medications   benzonatate (TESSALON) 100 MG capsule   Dextromethorphan-guaiFENesin (MUCINEX DM MAXIMUM STRENGTH) 60-1200 MG TB12      Plan  Discussed conservative treatments  Discussed r/se/b of medications  Educated on RTC/ED precautions  Educated on how antibiotics are not used for viral illnesses  Discussed course of illness and answered all questions   Return if symptoms worsen or fail to improve.    Huston Foley Isaul Landi, FNP-BC [facility] [place], [place] [address] Ph.  [phone] Fax [phone]

## 2020-03-05 NOTE — Telephone Encounter (Signed)
He tested positive for covid on 1/3. Unless he is still febrile he should have been good to return to work.

## 2020-03-05 NOTE — Telephone Encounter (Signed)
He would need to be seen in person for a cxr and labs for me to consider writing for antibiotics

## 2020-03-05 NOTE — Telephone Encounter (Signed)
Please see if pt is able to come by to do a in office visit in order to further evaluate what is going on.  Thanks,

## 2020-03-06 LAB — LIPID PANEL
Chol/HDL Ratio: 4.5 ratio (ref 0.0–5.0)
Cholesterol, Total: 154 mg/dL (ref 100–199)
HDL: 34 mg/dL — ABNORMAL LOW (ref >39)
LDL Chol Calc (NIH): 93 mg/dL (ref 0–99)
Triglycerides: 155 mg/dL — ABNORMAL HIGH (ref 0–149)
VLDL Cholesterol Cal: 27 mg/dL (ref 5–40)

## 2020-03-06 LAB — CMP14+EGFR
ALT: 24 IU/L (ref 0–44)
AST: 17 IU/L (ref 0–40)
Albumin/Globulin Ratio: 1.5 (ref 1.2–2.2)
Albumin: 4.2 g/dL (ref 4.0–5.0)
Alkaline Phosphatase: 70 IU/L (ref 44–121)
BUN/Creatinine Ratio: 14 (ref 9–20)
BUN: 14 mg/dL (ref 6–20)
Bilirubin Total: 0.5 mg/dL (ref 0.0–1.2)
CO2: 26 mmol/L (ref 20–29)
Calcium: 9.4 mg/dL (ref 8.7–10.2)
Chloride: 106 mmol/L (ref 96–106)
Creatinine, Ser: 1 mg/dL (ref 0.76–1.27)
GFR calc Af Amer: 113 mL/min/{1.73_m2} (ref >59)
GFR calc non Af Amer: 98 mL/min/{1.73_m2} (ref >59)
Globulin, Total: 2.8 g/dL (ref 1.5–4.5)
Glucose: 107 mg/dL — ABNORMAL HIGH (ref 65–99)
Potassium: 4.5 mmol/L (ref 3.5–5.2)
Sodium: 144 mmol/L (ref 134–144)
Total Protein: 7 g/dL (ref 6.0–8.5)

## 2020-03-06 LAB — VITAMIN D 25 HYDROXY (VIT D DEFICIENCY, FRACTURES): Vit D, 25-Hydroxy: 14.8 ng/mL — ABNORMAL LOW (ref 30.0–100.0)

## 2020-03-06 LAB — CBC WITH DIFFERENTIAL/PLATELET
Basophils Absolute: 0.1 10*3/uL (ref 0.0–0.2)
Basos: 1 %
EOS (ABSOLUTE): 0.5 10*3/uL — ABNORMAL HIGH (ref 0.0–0.4)
Eos: 5 %
Hematocrit: 46.5 % (ref 37.5–51.0)
Hemoglobin: 15.6 g/dL (ref 13.0–17.7)
Immature Grans (Abs): 0.1 10*3/uL (ref 0.0–0.1)
Immature Granulocytes: 1 %
Lymphocytes Absolute: 2.9 10*3/uL (ref 0.7–3.1)
Lymphs: 31 %
MCH: 28.3 pg (ref 26.6–33.0)
MCHC: 33.5 g/dL (ref 31.5–35.7)
MCV: 84 fL (ref 79–97)
Monocytes Absolute: 0.7 10*3/uL (ref 0.1–0.9)
Monocytes: 8 %
Neutrophils Absolute: 5.2 10*3/uL (ref 1.4–7.0)
Neutrophils: 54 %
Platelets: 221 10*3/uL (ref 150–450)
RBC: 5.51 x10E6/uL (ref 4.14–5.80)
RDW: 12.8 % (ref 11.6–15.4)
WBC: 9.4 10*3/uL (ref 3.4–10.8)

## 2020-03-06 LAB — HIV ANTIBODY (ROUTINE TESTING W REFLEX): HIV Screen 4th Generation wRfx: NONREACTIVE

## 2020-03-06 LAB — HEMOGLOBIN A1C
Est. average glucose Bld gHb Est-mCnc: 103 mg/dL
Hgb A1c MFr Bld: 5.2 % (ref 4.8–5.6)

## 2020-03-06 LAB — TSH: TSH: 2.14 u[IU]/mL (ref 0.450–4.500)

## 2020-03-06 LAB — HEPATITIS C ANTIBODY: Hep C Virus Ab: <0.1 s/co ratio (ref 0.0–0.9)

## 2020-06-09 ENCOUNTER — Encounter: Payer: Commercial Managed Care - PPO | Admitting: Family Medicine

## 2020-06-10 ENCOUNTER — Encounter: Payer: Self-pay | Admitting: Family Medicine

## 2021-08-08 ENCOUNTER — Emergency Department (HOSPITAL_BASED_OUTPATIENT_CLINIC_OR_DEPARTMENT_OTHER)
Admission: EM | Admit: 2021-08-08 | Discharge: 2021-08-08 | Disposition: A | Discharge status: Home / Self Care | Payer: Managed Care, Other (non HMO) | Attending: Emergency Medicine | Admitting: Emergency Medicine

## 2021-08-08 ENCOUNTER — Encounter (HOSPITAL_BASED_OUTPATIENT_CLINIC_OR_DEPARTMENT_OTHER): Payer: Self-pay

## 2021-08-08 ENCOUNTER — Other Ambulatory Visit: Payer: Self-pay

## 2021-08-08 DIAGNOSIS — Z23 Encounter for immunization: Secondary | ICD-10-CM | POA: Diagnosis not present

## 2021-08-08 DIAGNOSIS — W268XXA Contact with other sharp object(s), not elsewhere classified, initial encounter: Secondary | ICD-10-CM | POA: Diagnosis not present

## 2021-08-08 DIAGNOSIS — S61411A Laceration without foreign body of right hand, initial encounter: Secondary | ICD-10-CM | POA: Diagnosis not present

## 2021-08-08 MED ORDER — TETANUS-DIPHTH-ACELL PERTUSSIS 5-2.5-18.5 LF-MCG/0.5 IM SUSY
0.5000 mL | PREFILLED_SYRINGE | Freq: Once | INTRAMUSCULAR | Status: AC
  Administered 2021-08-08: 0.5 mL via INTRAMUSCULAR
  Filled 2021-08-08: qty 0.5

## 2021-08-08 MED ORDER — LIDOCAINE HCL 2 % IJ SOLN
10.0000 mL | Freq: Once | INTRAMUSCULAR | Status: AC
  Administered 2021-08-08: 200 mg
  Filled 2021-08-08: qty 20

## 2021-08-08 MED ORDER — BACITRACIN ZINC 500 UNIT/GM EX OINT
TOPICAL_OINTMENT | Freq: Once | CUTANEOUS | Status: AC
  Administered 2021-08-08: 1 via TOPICAL

## 2021-08-08 NOTE — ED Notes (Signed)
Patient denies pain and is resting comfortably.  

## 2021-08-08 NOTE — ED Provider Notes (Signed)
[facility] EMERGENCY DEPT Provider Note   CSN: [id number] Arrival date & time: 08/08/21  2039     History  Chief Complaint  Patient presents with   Laceration    Raymond Yates is a 36 y.o. male.   Laceration   Patient presents to the ED for evaluation of a laceration.  Patient states he had a grill and it lacerated webspace between his thumb and index finger.  Patient denies any numbness or weakness.  He is concerned about possible infection.  He is not sure when the last time he had a tetanus shot  Home Medications Prior to Admission medications   Medication Sig Start Date End Date Taking? Authorizing Provider  benzonatate (TESSALON) 100 MG capsule Take 1-2 capsules (100-200 mg total) by mouth 3 (three) times daily as needed for cough. 03/05/20   Just, Azalee Course, FNP  Dextromethorphan-guaiFENesin (MUCINEX DM MAXIMUM STRENGTH) 60-1200 MG TB12 Take 1 tablet by mouth every 12 (twelve) hours. 03/05/20   Just, Azalee Course, FNP      Allergies    Patient has no known allergies.    Review of Systems   Review of Systems  Physical Exam Updated Vital Signs BP (!) 138/92 (BP Location: Left Arm)   Pulse 77   Temp (!) 96.5 F (35.8 C) (Temporal)   Resp 20   Ht 1.88 m (6\' 2" )   Wt 102.1 kg   SpO2 99%   BMI 28.89 kg/m  Physical Exam Vitals and nursing note reviewed.  Constitutional:      General: He is not in acute distress.    Appearance: He is well-developed.  HENT:     Head: Normocephalic and atraumatic.     Right Ear: External ear normal.     Left Ear: External ear normal.  Eyes:     General: No scleral icterus.       Right eye: No discharge.        Left eye: No discharge.     Conjunctiva/sclera: Conjunctivae normal.  Neck:     Trachea: No tracheal deviation.  Cardiovascular:     Rate and Rhythm: Normal rate.  Pulmonary:     Effort: Pulmonary effort is normal. No respiratory distress.     Breath sounds: No stridor.  Abdominal:     General: There  is no distension.  Musculoskeletal:        General: No swelling or deformity.     Cervical back: Neck supple.  Skin:    General: Skin is warm and dry.     Findings: No rash.     Comments: Laceration noted between webspace of his thumb and index finger, no evidence of muscular injury, no evidence of tendon injury, distal neurovascular intact  Neurological:     Mental Status: He is alert.     Cranial Nerves: Cranial nerve deficit: no gross deficits.     ED Results / Procedures / Treatments   Labs (all labs ordered are listed, but only abnormal results are displayed) Labs Reviewed - No data to display  EKG None  Radiology No results found.  Procedures . Laceration Repair  Date/Time: 08/08/2021 9:43 PM  Performed by: 08/10/2021, MD Authorized by: Linwood Dibbles, MD   Consent:    Consent obtained:  Verbal   Consent given by:  Patient   Alternatives discussed:  No treatment Universal protocol:    Patient identity confirmed:  Verbally with patient Anesthesia:    Anesthesia method:  Local infiltration   Local anesthetic:  Lidocaine 2% w/o epi Laceration details:    Location: Webspace between thumb and index finger right hand.   Length (cm):  1   Depth (mm):  4 Pre-procedure details:    Preparation:  Patient was prepped and draped in usual sterile fashion Exploration:    Limited defect created (wound extended): no     Imaging outcome: foreign body not noted     Wound exploration: wound explored through full range of motion     Wound extent: no muscle damage noted, no tendon damage noted, no underlying fracture noted and no vascular damage noted     Contaminated: no   Treatment:    Area cleansed with:  Shur-Clens   Amount of cleaning:  Extensive   Debridement:  None   Undermining:  None Skin repair:    Repair method:  Sutures   Suture size:  4-0   Suture material:  Prolene   Suture technique:  Simple interrupted   Number of sutures:  3 Approximation:     Approximation:  Close Repair type:    Repair type:  Simple Post-procedure details:    Dressing:  Antibiotic ointment and sterile dressing   Procedure completion:  Tolerated well, no immediate complications     Medications Ordered in ED Medications  lidocaine (XYLOCAINE) 2 % (with pres) injection 200 mg (has no administration in time range)  Tdap (BOOSTRIX) injection 0.5 mL (has no administration in time range)  bacitracin ointment (has no administration in time range)    ED Course/ Medical Decision Making/ A&P                           Medical Decision Making Problems Addressed: Laceration of right hand without foreign body, initial encounter: acute illness or injury  Risk Prescription drug management.   Laceration without signs of neurovascular compromise.  Wound copiously irrigated.  Tetanus updated.        Final Clinical Impression(s) / ED Diagnoses Final diagnoses:  Laceration of right hand without foreign body, initial encounter    Rx / DC Orders ED Discharge Orders     None         Linwood Dibbles, MD 08/08/21 2145

## 2021-08-08 NOTE — ED Triage Notes (Signed)
Patient here POV from Home.  Endorses being at Home when he fell and lacerated his Right Lateral just above his Right Thumb against the Metal Grill Edge. Bleeding Controlled. Unknown Tetanus.  Approximately 1 Hour ago this Occurred.   NAD Noted during Triage. A&Ox4. GCS 15. Ambulatory.

## 2021-08-08 NOTE — ED Notes (Addendum)
ED Provider at bedside.  Pt awake and alert; GCS 15 - no acute distress noted.   Provider preparing to suture R palm side lac (medially distally located to R thumb) -- bleeding remains controlled when open to air.  Distal neurovascular status intact.

## 2021-08-08 NOTE — Discharge Instructions (Addendum)
Take over-the-counter medications as needed for pain.  Apply antibiotic ointment to the wound daily and keep a sterile dressing over the sutures.  Suture removal recommended in approximately 10 days

## 2023-04-15 ENCOUNTER — Encounter (HOSPITAL_BASED_OUTPATIENT_CLINIC_OR_DEPARTMENT_OTHER): Payer: Self-pay | Admitting: Emergency Medicine

## 2023-04-15 DIAGNOSIS — X58XXXA Exposure to other specified factors, initial encounter: Secondary | ICD-10-CM | POA: Diagnosis not present

## 2023-04-15 DIAGNOSIS — Y9368 Activity, volleyball (beach) (court): Secondary | ICD-10-CM | POA: Diagnosis not present

## 2023-04-15 DIAGNOSIS — M62838 Other muscle spasm: Secondary | ICD-10-CM | POA: Insufficient documentation

## 2023-04-15 DIAGNOSIS — M25511 Pain in right shoulder: Secondary | ICD-10-CM | POA: Diagnosis present

## 2023-04-15 NOTE — ED Triage Notes (Signed)
 Right shoulder pain. Started Friday while at work [employer] pain meds helped some Denies injury. Did play volleyball on tuesday

## 2023-04-16 ENCOUNTER — Emergency Department (HOSPITAL_BASED_OUTPATIENT_CLINIC_OR_DEPARTMENT_OTHER)
Admission: EM | Admit: 2023-04-16 | Discharge: 2023-04-16 | Disposition: A | Discharge status: Home / Self Care | Payer: Managed Care, Other (non HMO) | Attending: Emergency Medicine | Admitting: Emergency Medicine

## 2023-04-16 ENCOUNTER — Emergency Department (HOSPITAL_BASED_OUTPATIENT_CLINIC_OR_DEPARTMENT_OTHER): Payer: Managed Care, Other (non HMO) | Admitting: Radiology

## 2023-04-16 DIAGNOSIS — M62838 Other muscle spasm: Secondary | ICD-10-CM

## 2023-04-16 MED ORDER — HYDROCODONE-ACETAMINOPHEN 5-325 MG PO TABS: 1.0000 | ORAL_TABLET | ORAL | 0 refills | Status: AC | PRN

## 2023-04-16 MED ORDER — KETOROLAC TROMETHAMINE 60 MG/2ML IM SOLN
60.0000 mg | Freq: Once | INTRAMUSCULAR | Status: AC
  Administered 2023-04-16: 60 mg via INTRAMUSCULAR
  Filled 2023-04-16: qty 2

## 2023-04-16 MED ORDER — METHOCARBAMOL 500 MG PO TABS: 500.0000 mg | ORAL_TABLET | Freq: Three times a day (TID) | ORAL | 0 refills | Status: AC | PRN

## 2023-04-16 NOTE — ED Provider Notes (Signed)
 [place] EMERGENCY DEPARTMENT AT [facility] Provider Note   CSN: [id number] Arrival date & time: 04/15/23  2137     History  Chief Complaint  Patient presents with   Shoulder Pain    Raymond Yates is a 38 y.o. male.  Complains of right shoulder pain that has been ongoing for several days.  Patient reports pain is progressively worsened, not responding to over-the-counter medications.  He did play volleyball a couple of days before the pain started but no direct injury.       Home Medications Prior to Admission medications   Medication Sig Start Date End Date Taking? Authorizing Provider  HYDROcodone-acetaminophen (NORCO/VICODIN) 5-325 MG tablet Take 1 tablet by mouth every 4 (four) hours as needed for moderate pain (pain score 4-6). 04/16/23  Yes Heraclio Seidman, Canary Brim, MD  methocarbamol (ROBAXIN) 500 MG tablet Take 1 tablet (500 mg total) by mouth every 8 (eight) hours as needed for muscle spasms. 04/16/23  Yes Charliegh Vasudevan, Canary Brim, MD  benzonatate (TESSALON) 100 MG capsule Take 1-2 capsules (100-200 mg total) by mouth 3 (three) times daily as needed for cough. 03/05/20   Just, Azalee Course, FNP  Dextromethorphan-guaiFENesin (MUCINEX DM MAXIMUM STRENGTH) 60-1200 MG TB12 Take 1 tablet by mouth every 12 (twelve) hours. 03/05/20   Just, Azalee Course, FNP      Allergies    Patient has no known allergies.    Review of Systems   Review of Systems  Physical Exam Updated Vital Signs BP (!) 142/100 (BP Location: Left Arm)   Pulse 68   Temp 98.2 F (36.8 C) (Oral)   Resp 14   SpO2 100%  Physical Exam Vitals and nursing note reviewed.  Constitutional:      General: He is not in acute distress.    Appearance: He is well-developed.  HENT:     Head: Normocephalic and atraumatic.     Mouth/Throat:     Mouth: Mucous membranes are moist.  Eyes:     General: Vision grossly intact. Gaze aligned appropriately.     Extraocular Movements: Extraocular movements intact.      Conjunctiva/sclera: Conjunctivae normal.  Cardiovascular:     Rate and Rhythm: Normal rate and regular rhythm.     Pulses: Normal pulses.     Heart sounds: Normal heart sounds, S1 normal and S2 normal. No murmur heard.    No friction rub. No gallop.  Pulmonary:     Effort: Pulmonary effort is normal. No respiratory distress.     Breath sounds: Normal breath sounds.  Abdominal:     Palpations: Abdomen is soft.     Tenderness: There is no abdominal tenderness. There is no guarding or rebound.     Hernia: No hernia is present.  Musculoskeletal:        General: No swelling.     Right shoulder: Tenderness (Posterior shoulder, mainly trapezius soft tissue) present.       Arms:     Cervical back: Full passive range of motion without pain, normal range of motion and neck supple. No pain with movement, spinous process tenderness or muscular tenderness. Normal range of motion.     Right lower leg: No edema.     Left lower leg: No edema.  Skin:    General: Skin is warm and dry.     Capillary Refill: Capillary refill takes less than 2 seconds.     Findings: No ecchymosis, erythema, lesion or wound.  Neurological:     Mental Status: He is  alert and oriented to person, place, and time.     GCS: GCS eye subscore is 4. GCS verbal subscore is 5. GCS motor subscore is 6.     Cranial Nerves: Cranial nerves 2-12 are intact.     Sensory: Sensation is intact.     Motor: Motor function is intact. No weakness or abnormal muscle tone.     Coordination: Coordination is intact.  Psychiatric:        Mood and Affect: Mood normal.        Speech: Speech normal.        Behavior: Behavior normal.     ED Results / Procedures / Treatments   Labs (all labs ordered are listed, but only abnormal results are displayed) Labs Reviewed - No data to display  EKG None  Radiology DG Shoulder Right Result Date: 04/16/2023 CLINICAL DATA:  Right shoulder pain EXAM: RIGHT SHOULDER - 2+ VIEW COMPARISON:  None  Available. FINDINGS: No fracture or dislocation is seen. The joint spaces are preserved. Visualized soft tissues are within normal limits. Visualized right lung is clear. IMPRESSION: Negative. Electronically Signed   By: Charline Bills M.D.   On: 04/16/2023 00:59    Procedures Procedures    Medications Ordered in ED Medications  ketorolac (TORADOL) injection 60 mg (has no administration in time range)    ED Course/ Medical Decision Making/ A&P                                 Medical Decision Making Amount and/or Complexity of Data Reviewed Radiology: ordered.   Presents with right shoulder pain that has been persistent for several days.  No known injury.  Patient has spasm and severe tenderness of the superior posterior aspect of the trapezius muscle on the right.  Normal range of motion.  Normal distal strength and sensation in both upper extremities.  No posterior midline cervical tenderness or pain with range of motion of the neck to suggest radiculopathy.        Final Clinical Impression(s) / ED Diagnoses Final diagnoses:  Muscle spasm    Rx / DC Orders ED Discharge Orders          Ordered    methocarbamol (ROBAXIN) 500 MG tablet  Every 8 hours PRN        04/16/23 0111    HYDROcodone-acetaminophen (NORCO/VICODIN) 5-325 MG tablet  Every 4 hours PRN        04/16/23 0111              Gilda Crease, MD 04/16/23 0111

## 2023-04-19 ENCOUNTER — Encounter (HOSPITAL_BASED_OUTPATIENT_CLINIC_OR_DEPARTMENT_OTHER): Payer: Self-pay | Admitting: Student

## 2023-04-19 ENCOUNTER — Ambulatory Visit (HOSPITAL_BASED_OUTPATIENT_CLINIC_OR_DEPARTMENT_OTHER): Payer: Managed Care, Other (non HMO) | Admitting: Student

## 2023-04-19 ENCOUNTER — Ambulatory Visit (HOSPITAL_BASED_OUTPATIENT_CLINIC_OR_DEPARTMENT_OTHER): Payer: Managed Care, Other (non HMO)

## 2023-04-19 DIAGNOSIS — M542 Cervicalgia: Secondary | ICD-10-CM

## 2023-04-19 NOTE — Addendum Note (Signed)
 Addended by: Myer Haff on: 04/19/2023 12:08 PM   Modules accepted: Orders

## 2023-04-19 NOTE — Progress Notes (Signed)
 Chief Complaint: Neck and shoulder pain     History of Present Illness:    Raymond Yates is a 38 y.o. male presenting today for evaluation of neck and right shoulder pain.  This began about 5 days ago without any known injury however he had been playing volleyball a few days before.  The pain is described as sharp and does not radiate down the arm.  He was seen in the ED 3 days ago due to severe pain and was given hydrocodone and Robaxin which has provided mild relief.  Denies any numbness or tingling.  No surgical history but does report an AC joint injury to the shoulder during childhood.  He is right-hand dominant.   Surgical History:   None  PMH/PSH/Family History/Social History/Meds/Allergies:   History reviewed. No pertinent past medical history. History reviewed. No pertinent surgical history. Social History   Socioeconomic History   Marital status: Married    Spouse name: Not on file   Number of children: Not on file   Years of education: Not on file   Highest education level: Not on file  Occupational History   Not on file  Tobacco Use   Smoking status: Some Days    Types: Cigarettes   Smokeless tobacco: Former  Substance and Sexual Activity   Alcohol use: Not Currently   Drug use: Not Currently   Sexual activity: Not on file  Other Topics Concern   Not on file  Social History Narrative   Not on file   Social Drivers of Health   Financial Resource Strain: Not on file  Food Insecurity: Not on file  Transportation Needs: Not on file  Physical Activity: Not on file  Stress: Not on file  Social Connections: Unknown (07/05/2021)   Received from [facility]   Social Network    Social Network: Not on file   History reviewed. No pertinent family history. No Known Allergies Current Outpatient Medications  Medication Sig Dispense Refill   benzonatate (TESSALON) 100 MG capsule Take 1-2 capsules (100-200 mg  total) by mouth 3 (three) times daily as needed for cough. 40 capsule 0   Dextromethorphan-guaiFENesin (MUCINEX DM MAXIMUM STRENGTH) 60-1200 MG TB12 Take 1 tablet by mouth every 12 (twelve) hours. 45 tablet 0   HYDROcodone-acetaminophen (NORCO/VICODIN) 5-325 MG tablet Take 1 tablet by mouth every 4 (four) hours as needed for moderate pain (pain score 4-6). 10 tablet 0   methocarbamol (ROBAXIN) 500 MG tablet Take 1 tablet (500 mg total) by mouth every 8 (eight) hours as needed for muscle spasms. 20 tablet 0   No current facility-administered medications for this visit.   No results found.  Review of Systems:   A ROS was performed including pertinent positives and negatives as documented in the HPI.  Physical Exam :   Constitutional: NAD and appears stated age Neurological: Alert and oriented Psych: Appropriate affect and cooperative There were no vitals taken for this visit.   Comprehensive Musculoskeletal Exam:    Tenderness with palpation of the cervical spine paraspinal muscles.  Right-sided neck and upper trapezius pain with active cervical range of motion in all planes.  Negative Spurling's.  Large painful trigger point palpable in the right upper trapezius.  Full shoulder ROM bilaterally to 160 degrees forward flexion, 40 degrees external rotation, internal rotation  L4.  Negative crossarm adduction, empty can, and Neer/Hawkins.  Imaging:   Xray (cervical spine 4 views): Straightening of the cervical spine with mild degenerative disc between C5-C7.  Negative for acute abnormality.  Xray review from 04/16/23 (right shoulder 3 views): Negative for bony abnormality   I personally reviewed and interpreted the radiographs.   Assessment:   39 y.o. male with 5-day history of atraumatic right shoulder pain.  He has full range of motion and good strength in the shoulder so I have very low suspicion for true shoulder pathology.  Pain does seem more consistent with myofascial pain in the  upper trapezius area without presence of radicular symptoms.  I believe he would benefit significantly from physical therapy for targeted stretching and strengthening of this area as well as dry needling.  Will plan to begin short course of Meloxicam and follow up in 4 weeks if symptoms persist after beginning therapy.  Plan :    -Referral to physical therapy and follow-up in clinic as needed -Start meloxicam 15 mg daily     I personally saw and evaluated the patient, and participated in the management and treatment plan.  Hazle Nordmann, PA-C Orthopedics

## 2023-12-25 ENCOUNTER — Encounter: Payer: Self-pay | Admitting: Radiology
